# Patient Record
Sex: Male | Born: 1985 | Race: White | Hispanic: No | Marital: Single | Smoking: Current every day smoker
Health system: Southern US, Community
[De-identification: ages and names within clinical notes are randomized; demographics above are authoritative.]

---

## 2008-08-22 ENCOUNTER — Emergency Department: Payer: Self-pay | Admitting: Unknown Physician Specialty

## 2009-04-17 ENCOUNTER — Emergency Department: Payer: Self-pay | Admitting: Emergency Medicine
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
## 2010-01-21 ENCOUNTER — Emergency Department: Payer: Self-pay | Admitting: Emergency Medicine

## 2010-01-23 ENCOUNTER — Emergency Department: Payer: Self-pay | Admitting: Emergency Medicine

## 2010-02-19 ENCOUNTER — Emergency Department: Payer: Self-pay | Admitting: Emergency Medicine

## 2010-02-24 ENCOUNTER — Emergency Department: Payer: Self-pay | Admitting: Emergency Medicine

## 2010-03-13 ENCOUNTER — Emergency Department: Payer: Self-pay | Admitting: Emergency Medicine

## 2010-03-14 ENCOUNTER — Emergency Department: Payer: Self-pay | Admitting: Emergency Medicine

## 2010-05-19 ENCOUNTER — Emergency Department: Payer: Self-pay | Admitting: Emergency Medicine

## 2011-02-13 ENCOUNTER — Emergency Department: Payer: Self-pay | Admitting: Unknown Physician Specialty

## 2011-02-22 ENCOUNTER — Emergency Department: Payer: Self-pay | Admitting: Emergency Medicine

## 2011-06-24 ENCOUNTER — Emergency Department: Payer: Self-pay | Admitting: Emergency Medicine

## 2011-07-25 ENCOUNTER — Emergency Department: Payer: Self-pay | Admitting: Emergency Medicine

## 2011-12-09 ENCOUNTER — Emergency Department: Payer: Self-pay | Admitting: Emergency Medicine

## 2012-01-31 ENCOUNTER — Emergency Department: Payer: Self-pay | Admitting: Emergency Medicine

## 2012-02-03 ENCOUNTER — Emergency Department: Payer: Self-pay | Admitting: Emergency Medicine

## 2013-08-15 ENCOUNTER — Emergency Department: Payer: Self-pay | Admitting: Emergency Medicine

## 2014-05-07 IMAGING — CR DG ANKLE COMPLETE 3+V*L*
1 series · 3 of 3 positions shown · non-contrast
Comparison: None.

CLINICAL DATA: Injury.

EXAM:
LEFT ANKLE COMPLETE - 3+ VIEW

[Series 1: ap · 0.17mm/px · 3 of 3 slices shown]
[im 1/3]
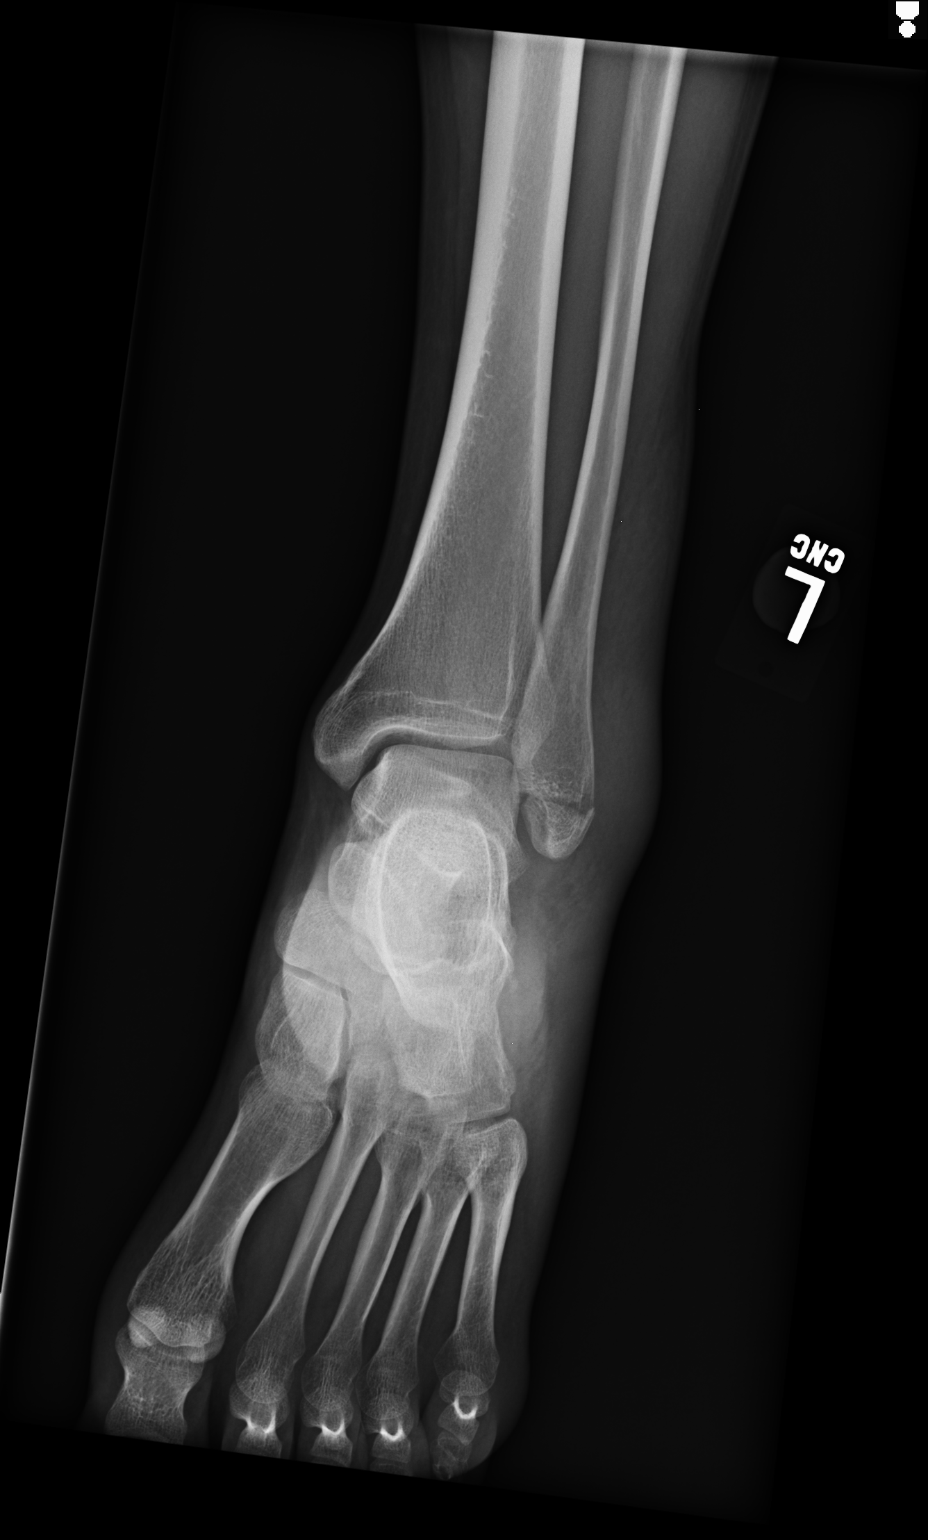
[im 2/3]
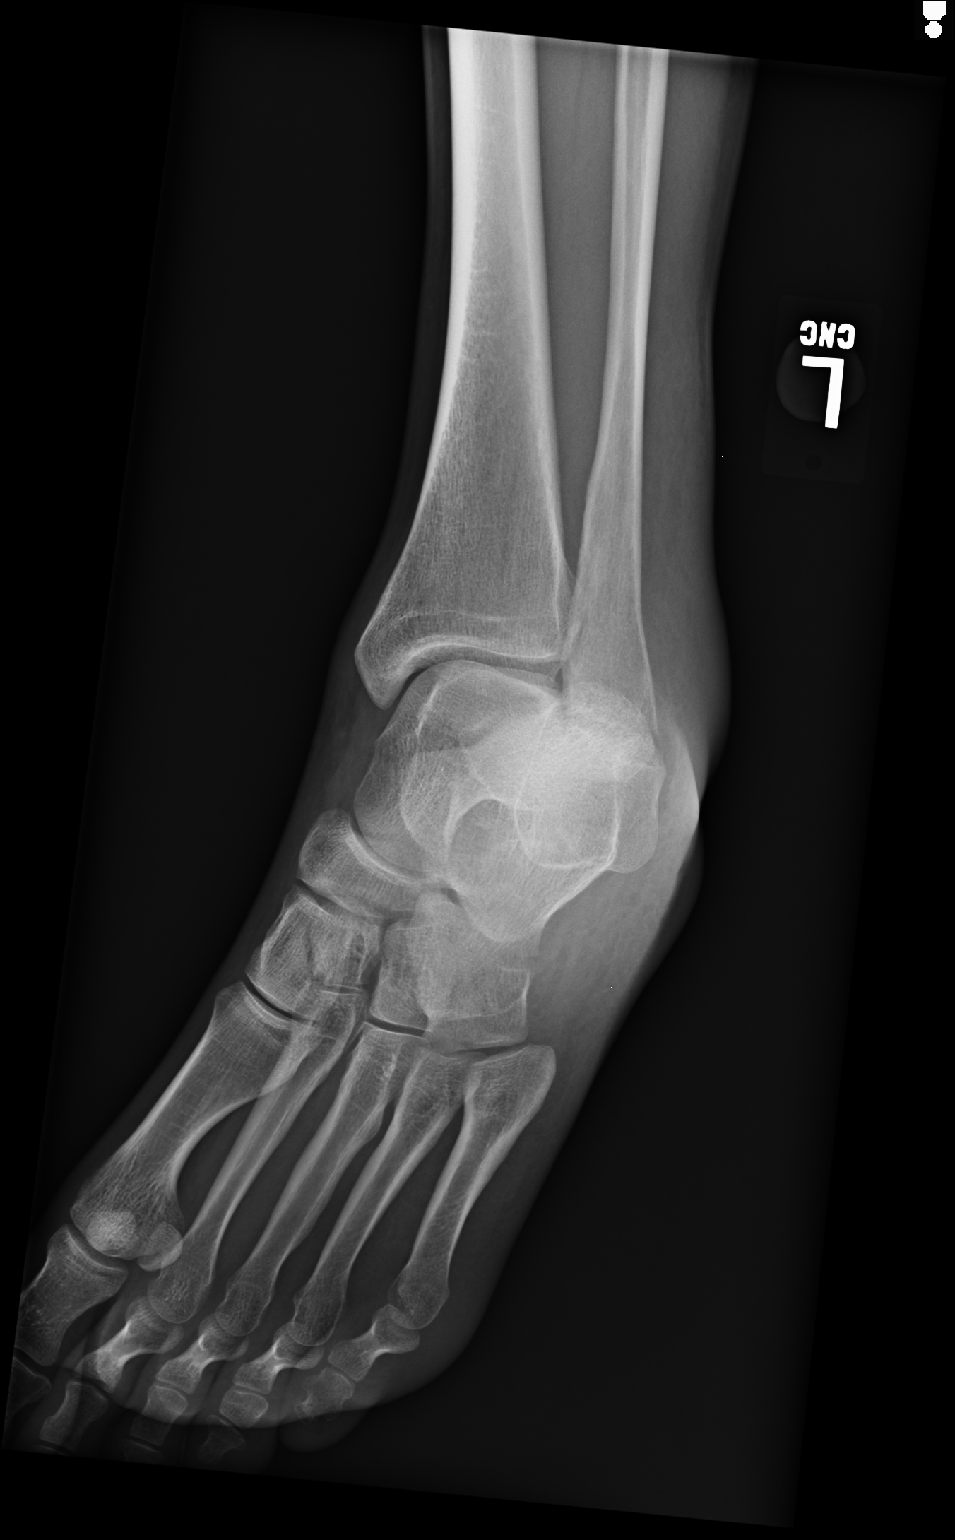
[im 3/3]
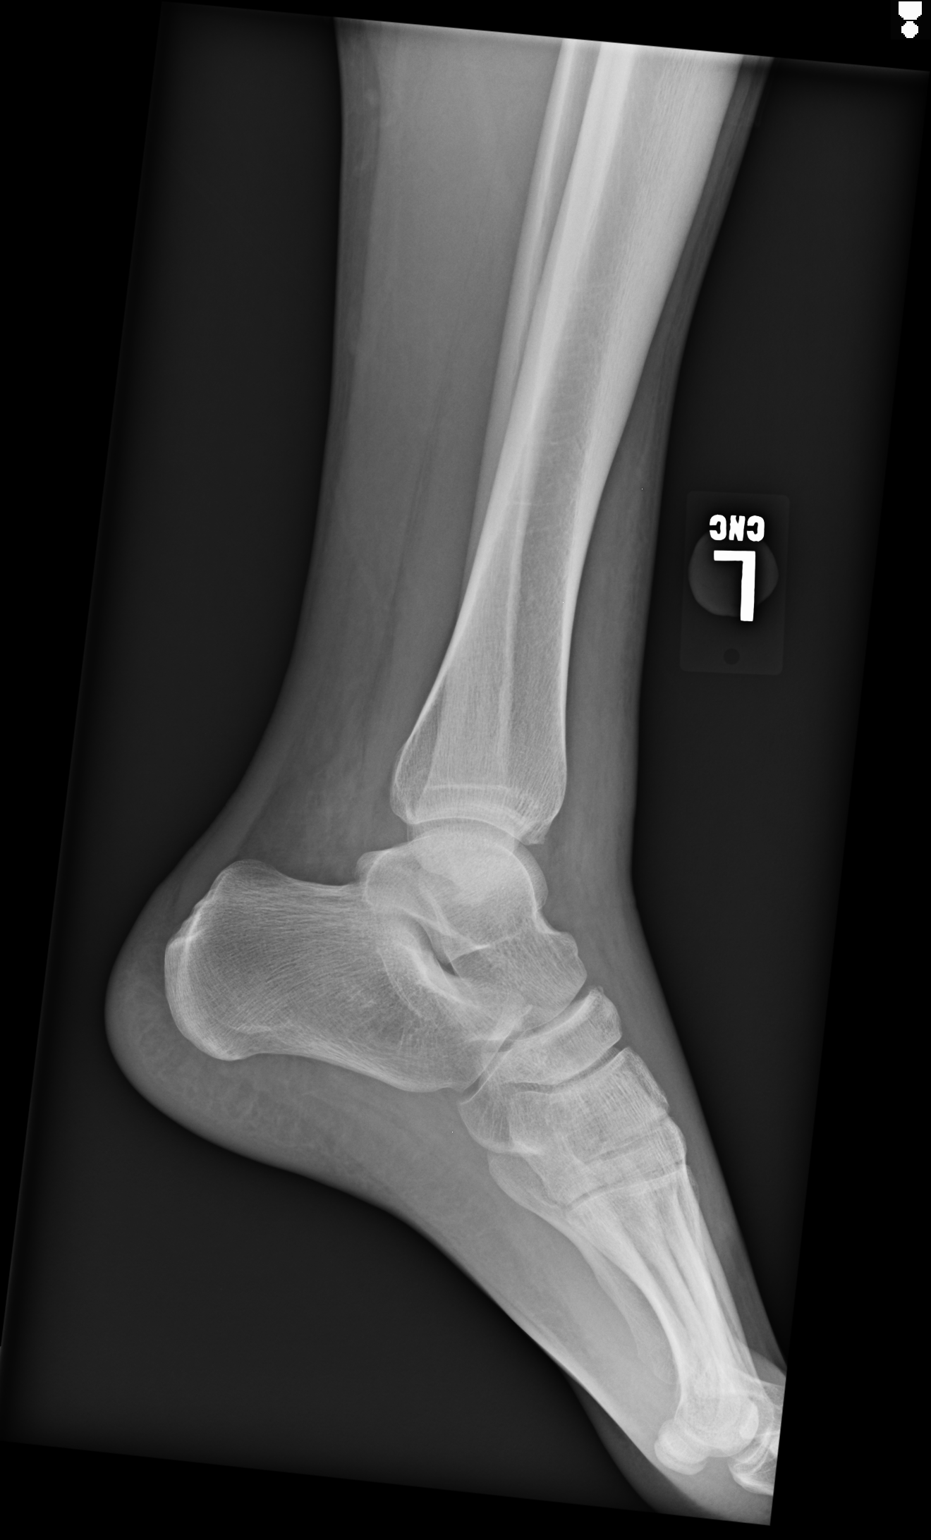

[3 of 3 positions shown; findings below may reference images not displayed]

FINDINGS: Transverse fracture of the lateral malleolus with adjacent soft
tissue swelling.

Question tiny avulsion fracture dorsal aspect of the navicular.
IMPRESSION: Transverse fracture of the lateral malleolus with adjacent soft
tissue swelling.

Question tiny avulsion fracture dorsal aspect of the navicular.

## 2016-10-02 ENCOUNTER — Emergency Department
Admission: EM | Admit: 2016-10-02 | Discharge: 2016-10-02 | Disposition: A | Discharge status: Home / Self Care | Payer: Self-pay | Attending: Emergency Medicine | Admitting: Emergency Medicine

## 2016-10-02 DIAGNOSIS — M5431 Sciatica, right side: Secondary | ICD-10-CM

## 2016-10-02 DIAGNOSIS — M5441 Lumbago with sciatica, right side: Secondary | ICD-10-CM | POA: Insufficient documentation

## 2016-10-02 DIAGNOSIS — F17200 Nicotine dependence, unspecified, uncomplicated: Secondary | ICD-10-CM | POA: Insufficient documentation

## 2016-10-02 MED ORDER — KETOROLAC TROMETHAMINE 60 MG/2ML IM SOLN
60.0000 mg | Freq: Once | INTRAMUSCULAR | Status: AC
  Administered 2016-10-02: 60 mg via INTRAMUSCULAR
  Filled 2016-10-02: qty 2

## 2016-10-02 MED ORDER — ORPHENADRINE CITRATE 30 MG/ML IJ SOLN
60.0000 mg | Freq: Once | INTRAMUSCULAR | Status: AC
  Administered 2016-10-02: 60 mg via INTRAMUSCULAR
  Filled 2016-10-02: qty 2

## 2016-10-02 MED ORDER — CYCLOBENZAPRINE HCL 5 MG PO TABS: 5.0000 mg | ORAL_TABLET | Freq: Three times a day (TID) | ORAL | 0 refills | Status: AC | PRN

## 2016-10-02 NOTE — ED Triage Notes (Signed)
Pt states he was lifting some heavy boxes and injured his right lower back yesterday , pain radiates into the leg.

## 2016-10-02 NOTE — ED Provider Notes (Signed)
[facility] Emergency Department Provider Note  ____________________________________________  Time seen: Approximately 11:47 AM  I have reviewed the triage vital signs and the nursing notes.   HISTORY  Chief Complaint Back Pain    HPI Timothy Duarte is a 31 y.o. male that presents to the emergency department with low right back pain since yesterday. Patient states he was lifting a heavy boxyesterday when he felt a sudden onset of low right back pain. Patient states that pain is primarily in the buttocks. Pain radiates down the back of right leg. Patient has been walking since incident but with pain. Pain is improved when patient lays on left side. Patient has taken Bayer's back and body today for pain. Patient denies fever, chills, shortness of breath, chest pain, nausea, vomiting, abdominal pain, bowel or bladder incontinence, saddle paresthesias.   History reviewed. No pertinent past medical history.  There are no active problems to display for this patient.   History reviewed. No pertinent surgical history.  Prior to Admission medications   Medication Sig Start Date End Date Taking? Authorizing Provider  cyclobenzaprine (FLEXERIL) 5 MG tablet Take 1 tablet (5 mg total) by mouth 3 (three) times daily as needed for muscle spasms. 10/02/16 10/09/16  Enid DerryAshley Naylee Frankowski, PA-C    Allergies Keflex [cephalexin]  No family history on file.  Social History Social History  Substance Use Topics  . Smoking status: Current Every Day Smoker  . Smokeless tobacco: Former NeurosurgeonUser  . Alcohol use Yes     Review of Systems  Constitutional: No fever/chills ENT: No upper respiratory complaints. Cardiovascular: No chest pain. Respiratory: No cough. No SOB. Gastrointestinal: No abdominal pain.  No nausea, no vomiting.  Genitourinary: Negative for dysuria. Skin: Negative for rash, abrasions, lacerations, ecchymosis. Neurological: Negative for headaches, numbness or  tingling   ____________________________________________   PHYSICAL EXAM:  VITAL SIGNS: ED Triage Vitals  Enc Vitals Group     BP 10/02/16 1022 137/83     Pulse Rate 10/02/16 1022 96     Resp 10/02/16 1022 18     Temp 10/02/16 1022 97.7 F (36.5 C)     Temp Source 10/02/16 1022 Oral     SpO2 10/02/16 1022 99 %     Weight 10/02/16 1023 163 lb (73.9 kg)     Height 10/02/16 1023 6\' 1"  (1.854 m)     Head Circumference --      Peak Flow --      Pain Score 10/02/16 1023 10     Pain Loc --      Pain Edu? --      Excl. in GC? --      Constitutional: Alert and oriented. Well appearing and in no acute distress. Eyes: Conjunctivae are normal. PERRL. EOMI. Head: Atraumatic. ENT:      Ears:      Nose: No congestion/rhinnorhea.      Mouth/Throat: Mucous membranes are moist.  Neck: No stridor. No cervical spine tenderness to palpation. Cardiovascular: Normal rate, regular rhythm.  Good peripheral circulation. Respiratory: Normal respiratory effort without tachypnea or retractions. Lungs CTAB. Good air entry to the bases with no decreased or absent breath sounds. Gastrointestinal: Bowel sounds 4 quadrants. Soft and nontender to palpation. No guarding or rigidity. No palpable masses. No distention. No CVA tenderness. Musculoskeletal:  No gross deformities appreciated. No tenderness to palpation over thoracic, lumbar spine. Tenderness to palpation over right buttocks. Positive straight leg raise and Faber Test. Negative cross leg raise. Strength 5 out  of 5 bilaterally.  Neurologic:  Normal speech and language. No gross focal neurologic deficits are appreciated.  Skin:  Skin is warm, dry and intact. No rash noted.   ____________________________________________   LABS (all labs ordered are listed, but only abnormal results are displayed)  Labs Reviewed - No data to  display ____________________________________________  EKG   ____________________________________________  RADIOLOGY  No results found.  ____________________________________________    PROCEDURES  Procedure(s) performed:    Procedures    Medications  ketorolac (TORADOL) injection 60 mg (60 mg Intramuscular Given 10/02/16 1157)  orphenadrine (NORFLEX) injection 60 mg (60 mg Intramuscular Given 10/02/16 1200)     ____________________________________________   INITIAL IMPRESSION / ASSESSMENT AND PLAN / ED COURSE  Pertinent labs & imaging results that were available during my care of the patient were reviewed by me and considered in my medical decision making (see chart for details).  Review of the [place] CSRS was performed in accordance of the [facility] prior to dispensing any controlled drugs.     Patient's diagnosis is consistent with sciatica. Vital signs and exam are reassuring. He was given Toradol and Norflex in ED. patient states that his pain significantly improved after medication. Patient should take ibuprofen at home. Patient will be discharged home with prescriptions for Flexeril. Patient is to follow up with PCP as directed. Patient is given ED precautions to return to the ED for any worsening or new symptoms.  No evidence of cauda hematoma, vascular catastrophe, spinal abscess/hematoma, or referred intra-abdominal pain. All patients questions questions were answered.      ____________________________________________  FINAL CLINICAL IMPRESSION(S) / ED DIAGNOSES  Final diagnoses:  Sciatica of right side      NEW MEDICATIONS STARTED DURING THIS VISIT:  Discharge Medication List as of 10/02/2016 12:49 PM    START taking these medications   Details  cyclobenzaprine (FLEXERIL) 5 MG tablet Take 1 tablet (5 mg total) by mouth 3 (three) times daily as needed for muscle spasms., Starting Thu 10/02/2016, Until Thu 10/09/2016, Print            This chart was  dictated using voice recognition software/Dragon. Despite best efforts to proofread, errors can occur which can change the meaning. Any change was purely unintentional.    Enid Derry, PA-C 10/02/16 1303    Sharman Cheek, MD 10/07/16 [phone]

## 2016-10-02 NOTE — ED Notes (Signed)
Pt states was lifting a heavy box yesterday at work when he had sudden onset back pain, states that pain radiates down to his R ankle at this time. Pt is alert and oriented, visible discomfort noted at this time.
